# Patient Record
Sex: Female | Born: 1955
Health system: Southern US, Community
[De-identification: ages and names within clinical notes are randomized; demographics above are authoritative.]

---

## 2016-02-15 ENCOUNTER — Other Ambulatory Visit: Payer: Self-pay | Admitting: Internal Medicine

## 2016-02-15 DIAGNOSIS — R911 Solitary pulmonary nodule: Secondary | ICD-10-CM

## 2016-02-22 ENCOUNTER — Ambulatory Visit
Admission: RE | Admit: 2016-02-22 | Discharge: 2016-02-22 | Disposition: A | Discharge status: Home / Self Care | Payer: BLUE CROSS/BLUE SHIELD | Source: Ambulatory Visit | Attending: Internal Medicine | Admitting: Internal Medicine

## 2016-02-22 DIAGNOSIS — R911 Solitary pulmonary nodule: Secondary | ICD-10-CM

## 2016-03-27 DIAGNOSIS — E782 Mixed hyperlipidemia: Secondary | ICD-10-CM | POA: Diagnosis not present

## 2016-03-27 DIAGNOSIS — R911 Solitary pulmonary nodule: Secondary | ICD-10-CM | POA: Diagnosis not present

## 2016-03-27 DIAGNOSIS — I10 Essential (primary) hypertension: Secondary | ICD-10-CM | POA: Diagnosis not present

## 2016-03-27 DIAGNOSIS — E663 Overweight: Secondary | ICD-10-CM | POA: Diagnosis not present

## 2016-05-09 ENCOUNTER — Other Ambulatory Visit: Payer: Self-pay | Admitting: Obstetrics & Gynecology

## 2016-05-09 ENCOUNTER — Other Ambulatory Visit (HOSPITAL_COMMUNITY)
Admission: RE | Admit: 2016-05-09 | Discharge: 2016-05-09 | Disposition: A | Discharge status: Home / Self Care | Payer: BLUE CROSS/BLUE SHIELD | Source: Ambulatory Visit | Attending: Obstetrics & Gynecology | Admitting: Obstetrics & Gynecology

## 2016-05-09 DIAGNOSIS — Z01419 Encounter for gynecological examination (general) (routine) without abnormal findings: Secondary | ICD-10-CM | POA: Diagnosis not present

## 2016-05-09 DIAGNOSIS — Z1151 Encounter for screening for human papillomavirus (HPV): Secondary | ICD-10-CM | POA: Diagnosis not present

## 2016-05-11 LAB — CYTOLOGY - PAP

## 2016-05-12 ENCOUNTER — Other Ambulatory Visit: Payer: Self-pay

## 2016-05-12 DIAGNOSIS — Z1231 Encounter for screening mammogram for malignant neoplasm of breast: Secondary | ICD-10-CM

## 2016-05-26 ENCOUNTER — Ambulatory Visit: Payer: BLUE CROSS/BLUE SHIELD

## 2016-06-06 ENCOUNTER — Other Ambulatory Visit: Payer: Self-pay | Admitting: Obstetrics & Gynecology

## 2016-06-06 ENCOUNTER — Ambulatory Visit
Admission: RE | Admit: 2016-06-06 | Discharge: 2016-06-06 | Disposition: A | Discharge status: Home / Self Care | Payer: BLUE CROSS/BLUE SHIELD | Source: Ambulatory Visit

## 2016-06-06 DIAGNOSIS — Z1231 Encounter for screening mammogram for malignant neoplasm of breast: Secondary | ICD-10-CM

## 2016-09-01 DIAGNOSIS — Z0000 Encounter for general adult medical examination without abnormal findings: Secondary | ICD-10-CM | POA: Diagnosis not present

## 2016-09-01 DIAGNOSIS — Z23 Encounter for immunization: Secondary | ICD-10-CM | POA: Diagnosis not present

## 2016-09-01 DIAGNOSIS — E7800 Pure hypercholesterolemia, unspecified: Secondary | ICD-10-CM | POA: Diagnosis not present

## 2016-09-01 DIAGNOSIS — E663 Overweight: Secondary | ICD-10-CM | POA: Diagnosis not present

## 2017-02-02 ENCOUNTER — Ambulatory Visit
Admission: RE | Admit: 2017-02-02 | Discharge: 2017-02-02 | Disposition: A | Discharge status: Home / Self Care | Payer: BLUE CROSS/BLUE SHIELD | Source: Ambulatory Visit | Attending: Nurse Practitioner | Admitting: Nurse Practitioner

## 2017-02-02 ENCOUNTER — Other Ambulatory Visit: Payer: Self-pay | Admitting: Nurse Practitioner

## 2017-02-02 DIAGNOSIS — M79672 Pain in left foot: Secondary | ICD-10-CM

## 2017-02-02 DIAGNOSIS — M79675 Pain in left toe(s): Secondary | ICD-10-CM | POA: Diagnosis not present

## 2017-05-10 DIAGNOSIS — Z01411 Encounter for gynecological examination (general) (routine) with abnormal findings: Secondary | ICD-10-CM | POA: Diagnosis not present

## 2017-05-10 DIAGNOSIS — R61 Generalized hyperhidrosis: Secondary | ICD-10-CM | POA: Diagnosis not present

## 2017-05-10 DIAGNOSIS — N898 Other specified noninflammatory disorders of vagina: Secondary | ICD-10-CM | POA: Diagnosis not present

## 2017-07-11 ENCOUNTER — Other Ambulatory Visit: Payer: Self-pay | Admitting: Obstetrics & Gynecology

## 2017-07-11 DIAGNOSIS — Z1231 Encounter for screening mammogram for malignant neoplasm of breast: Secondary | ICD-10-CM

## 2017-07-18 ENCOUNTER — Ambulatory Visit
Admission: RE | Admit: 2017-07-18 | Discharge: 2017-07-18 | Disposition: A | Discharge status: Home / Self Care | Payer: BLUE CROSS/BLUE SHIELD | Source: Ambulatory Visit | Attending: Obstetrics & Gynecology | Admitting: Obstetrics & Gynecology

## 2017-07-18 DIAGNOSIS — Z1231 Encounter for screening mammogram for malignant neoplasm of breast: Secondary | ICD-10-CM

## 2017-09-03 ENCOUNTER — Other Ambulatory Visit: Payer: Self-pay | Admitting: Internal Medicine

## 2017-09-03 DIAGNOSIS — R911 Solitary pulmonary nodule: Secondary | ICD-10-CM | POA: Diagnosis not present

## 2017-09-03 DIAGNOSIS — I77810 Thoracic aortic ectasia: Secondary | ICD-10-CM

## 2017-09-03 DIAGNOSIS — E7800 Pure hypercholesterolemia, unspecified: Secondary | ICD-10-CM | POA: Diagnosis not present

## 2017-09-03 DIAGNOSIS — M2550 Pain in unspecified joint: Secondary | ICD-10-CM | POA: Diagnosis not present

## 2017-09-03 DIAGNOSIS — Z0000 Encounter for general adult medical examination without abnormal findings: Secondary | ICD-10-CM | POA: Diagnosis not present

## 2017-09-03 DIAGNOSIS — I10 Essential (primary) hypertension: Secondary | ICD-10-CM | POA: Diagnosis not present

## 2017-09-04 ENCOUNTER — Other Ambulatory Visit: Payer: Self-pay | Admitting: Internal Medicine

## 2017-09-04 ENCOUNTER — Ambulatory Visit
Admission: RE | Admit: 2017-09-04 | Discharge: 2017-09-04 | Disposition: A | Discharge status: Home / Self Care | Payer: BLUE CROSS/BLUE SHIELD | Source: Ambulatory Visit | Attending: Internal Medicine | Admitting: Internal Medicine

## 2017-09-04 DIAGNOSIS — I77810 Thoracic aortic ectasia: Secondary | ICD-10-CM

## 2017-09-04 MED ORDER — IOPAMIDOL (ISOVUE-370) INJECTION 76%: 75.0000 mL | Freq: Once | INTRAVENOUS | Status: DC | PRN

## 2017-09-05 ENCOUNTER — Ambulatory Visit
Admission: RE | Admit: 2017-09-05 | Discharge: 2017-09-05 | Disposition: A | Discharge status: Home / Self Care | Payer: BLUE CROSS/BLUE SHIELD | Source: Ambulatory Visit | Attending: Internal Medicine | Admitting: Internal Medicine

## 2017-09-05 DIAGNOSIS — I77810 Thoracic aortic ectasia: Secondary | ICD-10-CM

## 2017-09-05 DIAGNOSIS — I712 Thoracic aortic aneurysm, without rupture: Secondary | ICD-10-CM | POA: Diagnosis not present

## 2017-09-05 MED ORDER — IOPAMIDOL (ISOVUE-370) INJECTION 76%
75.0000 mL | Freq: Once | INTRAVENOUS | Status: AC | PRN
  Administered 2017-09-05: 75 mL via INTRAVENOUS

## 2017-10-16 DIAGNOSIS — Z23 Encounter for immunization: Secondary | ICD-10-CM | POA: Diagnosis not present

## 2017-11-07 DIAGNOSIS — M2550 Pain in unspecified joint: Secondary | ICD-10-CM | POA: Diagnosis not present

## 2017-11-07 DIAGNOSIS — R768 Other specified abnormal immunological findings in serum: Secondary | ICD-10-CM | POA: Diagnosis not present

## 2018-02-04 DIAGNOSIS — M2550 Pain in unspecified joint: Secondary | ICD-10-CM | POA: Diagnosis not present

## 2018-02-04 DIAGNOSIS — M7989 Other specified soft tissue disorders: Secondary | ICD-10-CM | POA: Diagnosis not present

## 2018-02-04 DIAGNOSIS — R768 Other specified abnormal immunological findings in serum: Secondary | ICD-10-CM | POA: Diagnosis not present

## 2018-03-01 ENCOUNTER — Other Ambulatory Visit: Payer: Self-pay | Admitting: Internal Medicine

## 2018-03-01 DIAGNOSIS — R911 Solitary pulmonary nodule: Secondary | ICD-10-CM

## 2018-03-15 ENCOUNTER — Other Ambulatory Visit: Payer: BLUE CROSS/BLUE SHIELD

## 2018-03-19 ENCOUNTER — Ambulatory Visit
Admission: RE | Admit: 2018-03-19 | Discharge: 2018-03-19 | Disposition: A | Discharge status: Home / Self Care | Payer: BLUE CROSS/BLUE SHIELD | Source: Ambulatory Visit | Attending: Internal Medicine | Admitting: Internal Medicine

## 2018-03-19 DIAGNOSIS — R911 Solitary pulmonary nodule: Secondary | ICD-10-CM | POA: Diagnosis not present

## 2018-05-13 DIAGNOSIS — Z01419 Encounter for gynecological examination (general) (routine) without abnormal findings: Secondary | ICD-10-CM | POA: Diagnosis not present

## 2018-05-14 IMAGING — CR DG TOE 2ND 2+V*L*
3 series · 3 of 3 positions shown · non-contrast
Comparison: None.

CLINICAL DATA: Pain for 1 week

EXAM:
LEFT SECOND TOE:  3 V

[t toes ap left]
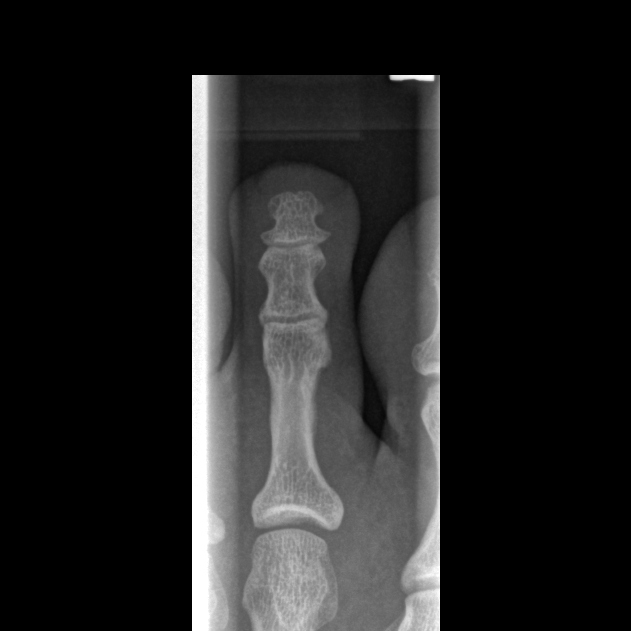

[t toes oblique left]
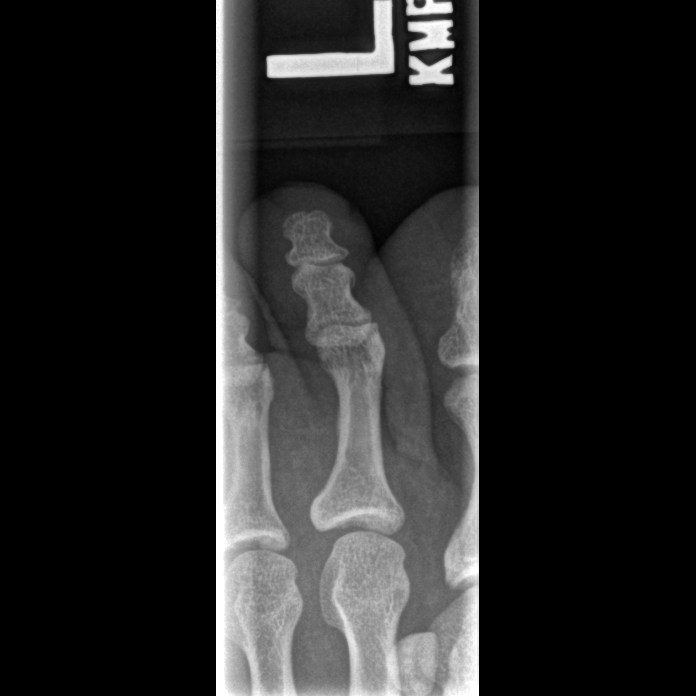

[t toes lateral left]
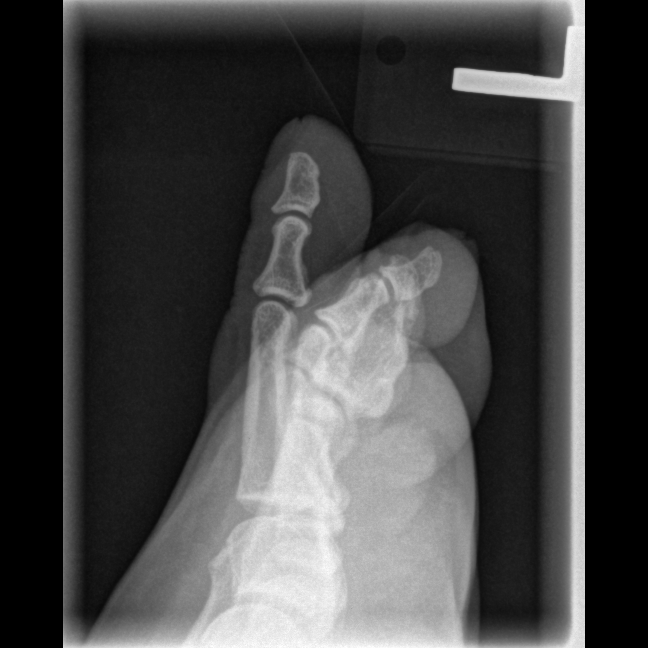

[3 of 3 positions shown; findings below may reference images not displayed]

FINDINGS: Frontal, oblique, and lateral views were obtained. There is no
fracture or dislocation. Joint spaces appear normal. No erosive
change.
IMPRESSION: No fracture or dislocation.  No evident arthropathy.

## 2018-07-09 DIAGNOSIS — R300 Dysuria: Secondary | ICD-10-CM | POA: Diagnosis not present

## 2018-09-19 ENCOUNTER — Other Ambulatory Visit: Payer: Self-pay | Admitting: Internal Medicine

## 2018-09-19 DIAGNOSIS — I77810 Thoracic aortic ectasia: Secondary | ICD-10-CM | POA: Diagnosis not present

## 2018-09-19 DIAGNOSIS — Z23 Encounter for immunization: Secondary | ICD-10-CM | POA: Diagnosis not present

## 2018-09-19 DIAGNOSIS — R739 Hyperglycemia, unspecified: Secondary | ICD-10-CM | POA: Diagnosis not present

## 2018-09-19 DIAGNOSIS — I10 Essential (primary) hypertension: Secondary | ICD-10-CM | POA: Diagnosis not present

## 2018-09-19 DIAGNOSIS — Z0000 Encounter for general adult medical examination without abnormal findings: Secondary | ICD-10-CM | POA: Diagnosis not present

## 2018-09-19 DIAGNOSIS — E7800 Pure hypercholesterolemia, unspecified: Secondary | ICD-10-CM | POA: Diagnosis not present

## 2018-09-26 ENCOUNTER — Ambulatory Visit
Admission: RE | Admit: 2018-09-26 | Discharge: 2018-09-26 | Disposition: A | Discharge status: Home / Self Care | Payer: BLUE CROSS/BLUE SHIELD | Source: Ambulatory Visit | Attending: Internal Medicine | Admitting: Internal Medicine

## 2018-09-26 DIAGNOSIS — I77810 Thoracic aortic ectasia: Secondary | ICD-10-CM

## 2018-09-26 DIAGNOSIS — R918 Other nonspecific abnormal finding of lung field: Secondary | ICD-10-CM | POA: Diagnosis not present

## 2018-09-26 MED ORDER — IOPAMIDOL (ISOVUE-370) INJECTION 76%
75.0000 mL | Freq: Once | INTRAVENOUS | Status: AC | PRN
  Administered 2018-09-26: 75 mL via INTRAVENOUS

## 2018-10-09 DIAGNOSIS — Z01818 Encounter for other preprocedural examination: Secondary | ICD-10-CM | POA: Diagnosis not present

## 2018-10-27 IMAGING — MG DIGITAL SCREENING BILATERAL MAMMOGRAM WITH CAD
7 series · 7 of 7 positions shown · non-contrast
Comparison: Previous exam(s).

CLINICAL DATA: Screening.

EXAM:
DIGITAL SCREENING BILATERAL MAMMOGRAM WITH CAD

[R CC (1 of 2)]
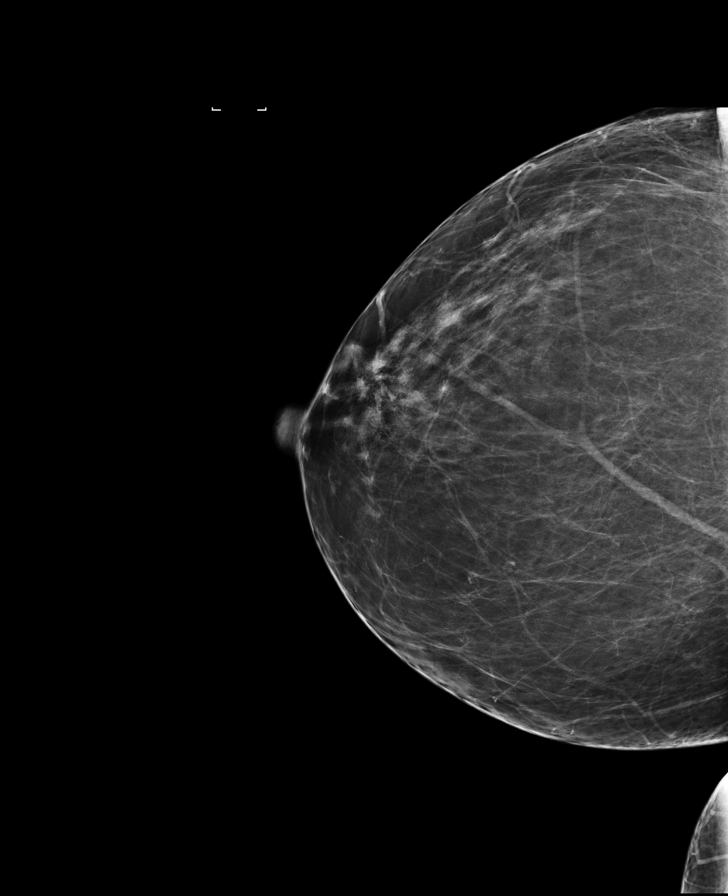

[L MLO (1 of 2)]
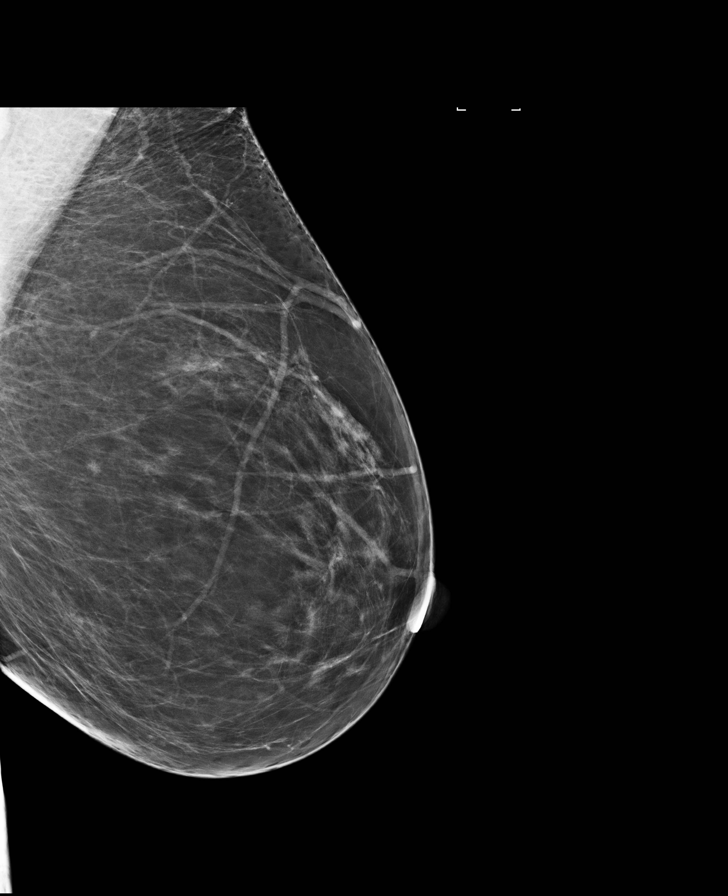

[L CC]
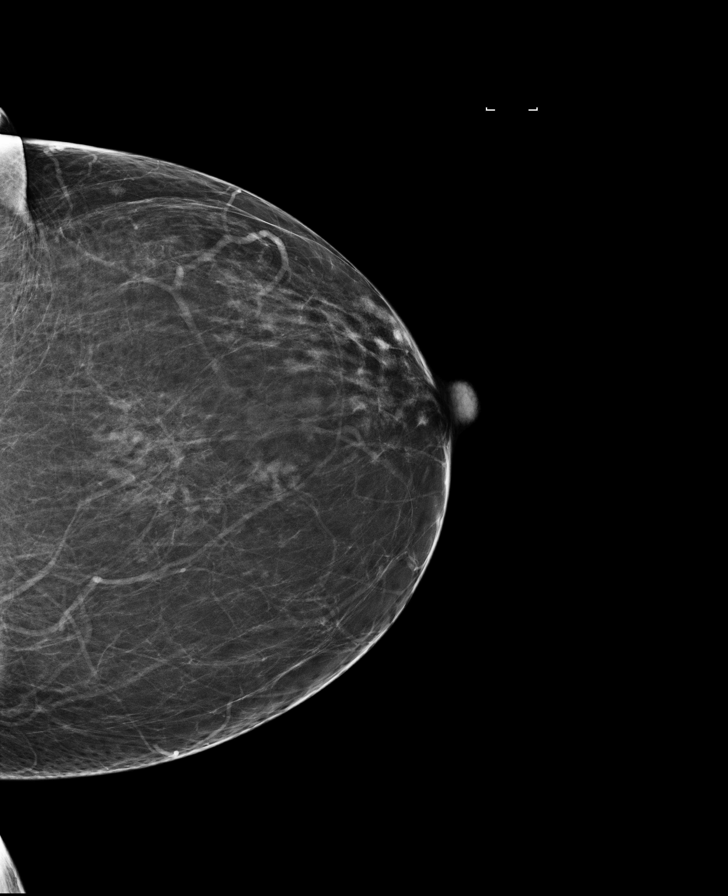

[L MLO (2 of 2)]
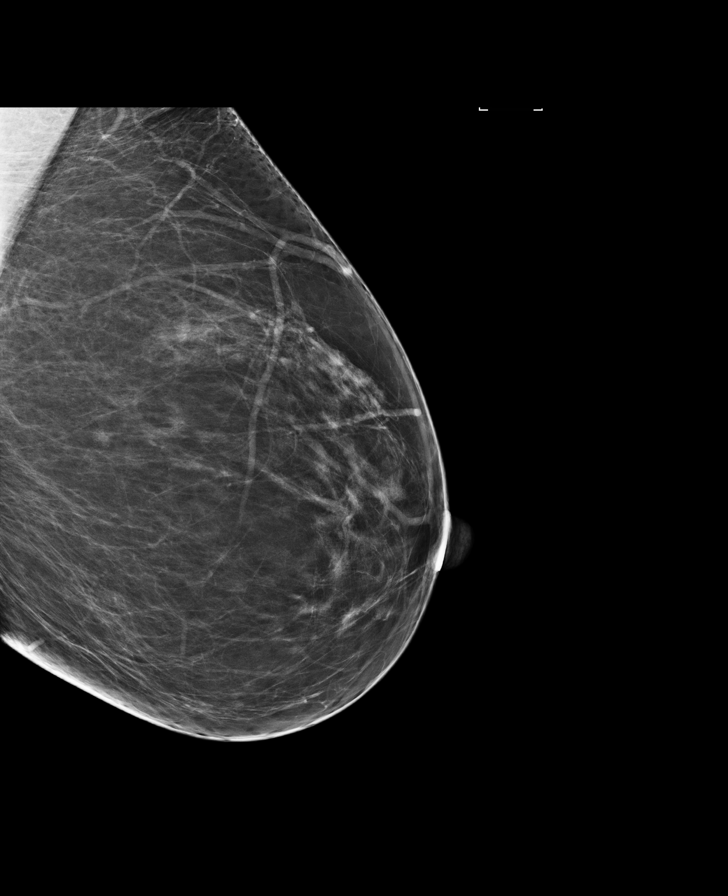

[R CC (2 of 2)]
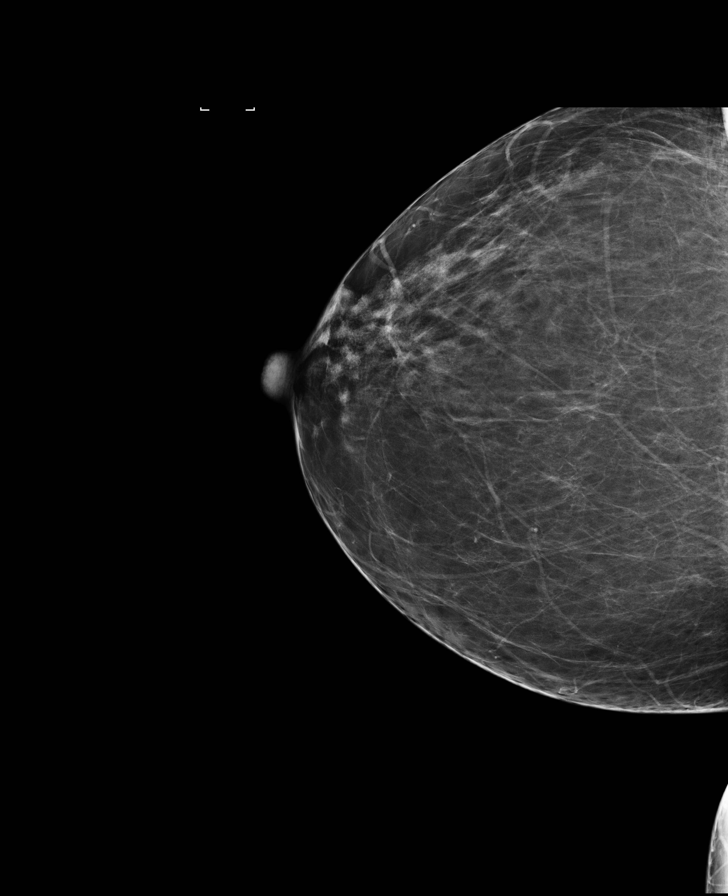

[R MLO (1 of 2)]
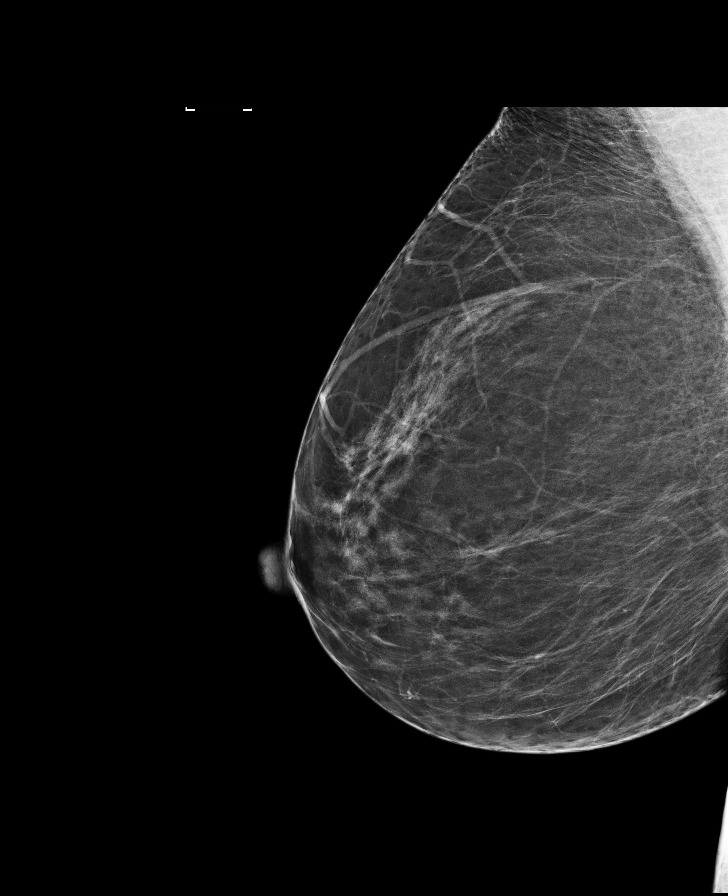

[R MLO (2 of 2)]
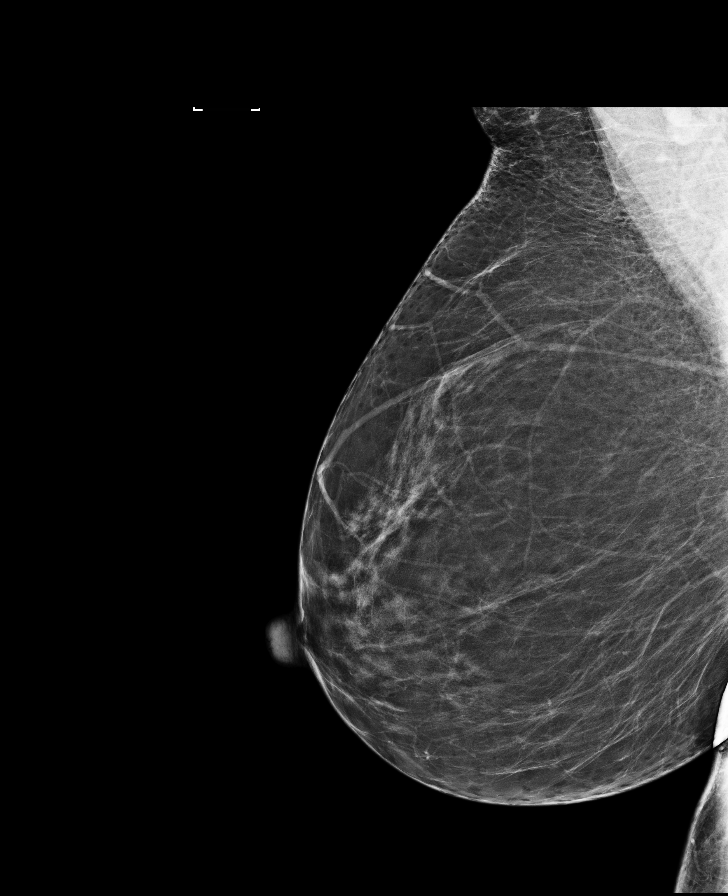

[7 of 7 positions shown; findings below may reference images not displayed]

ACR Breast Density Category b: There are scattered areas of
fibroglandular density.
FINDINGS: There are no findings suspicious for malignancy. Images were
processed with CAD.
IMPRESSION: No mammographic evidence of malignancy. A result letter of this
screening mammogram will be mailed directly to the patient.

RECOMMENDATION:
Screening mammogram in one year. (Code:AS-G-LCT)

BI-RADS CATEGORY  1: Negative.

## 2018-11-06 DIAGNOSIS — K5730 Diverticulosis of large intestine without perforation or abscess without bleeding: Secondary | ICD-10-CM | POA: Diagnosis not present

## 2018-11-06 DIAGNOSIS — Z1211 Encounter for screening for malignant neoplasm of colon: Secondary | ICD-10-CM | POA: Diagnosis not present

## 2018-11-06 DIAGNOSIS — K635 Polyp of colon: Secondary | ICD-10-CM | POA: Diagnosis not present

## 2018-11-08 DIAGNOSIS — K635 Polyp of colon: Secondary | ICD-10-CM | POA: Diagnosis not present

## 2018-11-08 DIAGNOSIS — Z1211 Encounter for screening for malignant neoplasm of colon: Secondary | ICD-10-CM | POA: Diagnosis not present

## 2019-07-04 DIAGNOSIS — Z01419 Encounter for gynecological examination (general) (routine) without abnormal findings: Secondary | ICD-10-CM | POA: Diagnosis not present

## 2019-07-07 ENCOUNTER — Other Ambulatory Visit: Payer: Self-pay | Admitting: Obstetrics & Gynecology

## 2019-07-07 DIAGNOSIS — Z1231 Encounter for screening mammogram for malignant neoplasm of breast: Secondary | ICD-10-CM

## 2019-08-20 ENCOUNTER — Ambulatory Visit
Admission: RE | Admit: 2019-08-20 | Discharge: 2019-08-20 | Disposition: A | Discharge status: Home / Self Care | Payer: BC Managed Care – PPO | Source: Ambulatory Visit | Attending: Obstetrics & Gynecology | Admitting: Obstetrics & Gynecology

## 2019-08-20 ENCOUNTER — Other Ambulatory Visit: Payer: Self-pay

## 2019-08-20 DIAGNOSIS — Z1231 Encounter for screening mammogram for malignant neoplasm of breast: Secondary | ICD-10-CM

## 2019-09-30 DIAGNOSIS — Z23 Encounter for immunization: Secondary | ICD-10-CM | POA: Diagnosis not present

## 2019-10-22 DIAGNOSIS — E7800 Pure hypercholesterolemia, unspecified: Secondary | ICD-10-CM | POA: Diagnosis not present

## 2019-10-22 DIAGNOSIS — E663 Overweight: Secondary | ICD-10-CM | POA: Diagnosis not present

## 2019-10-22 DIAGNOSIS — I10 Essential (primary) hypertension: Secondary | ICD-10-CM | POA: Diagnosis not present

## 2019-10-22 DIAGNOSIS — R7303 Prediabetes: Secondary | ICD-10-CM | POA: Diagnosis not present

## 2019-10-22 DIAGNOSIS — Z0000 Encounter for general adult medical examination without abnormal findings: Secondary | ICD-10-CM | POA: Diagnosis not present

## 2019-10-22 DIAGNOSIS — I77810 Thoracic aortic ectasia: Secondary | ICD-10-CM | POA: Diagnosis not present

## 2020-01-14 ENCOUNTER — Ambulatory Visit: Payer: BC Managed Care – PPO | Attending: Internal Medicine

## 2020-01-14 DIAGNOSIS — Z23 Encounter for immunization: Secondary | ICD-10-CM | POA: Insufficient documentation

## 2020-01-14 NOTE — Progress Notes (Signed)
   Covid-19 Vaccination Clinic  Name:  Naveah Brave    MRN: [id number] DOB: Apr 19, 1956  01/14/2020  Ms. Italiano was observed post Covid-19 immunization for 15 minutes without incidence. She was provided with Vaccine Information Sheet and instruction to access the V-Safe system.   Ms. Kegel was instructed to call 911 with any severe reactions post vaccine: Marland Kitchen Difficulty breathing  . Swelling of your face and throat  . A fast heartbeat  . A bad rash all over your body  . Dizziness and weakness    Immunizations Administered    Name Date Dose VIS Date Route   Pfizer COVID-19 Vaccine 01/14/2020 12:43 PM 0.3 mL 12/05/2019 Intramuscular   Manufacturer: [employer], [employer]   Lot: [id number]   NDC: [address]-0

## 2020-02-03 ENCOUNTER — Ambulatory Visit: Payer: BC Managed Care – PPO | Attending: Internal Medicine

## 2020-02-03 DIAGNOSIS — Z23 Encounter for immunization: Secondary | ICD-10-CM | POA: Insufficient documentation

## 2020-02-03 NOTE — Progress Notes (Signed)
   Covid-19 Vaccination Clinic  Name:  Rachel Gray    MRN: [id number] DOB: [birth year]  02/03/2020  Rachel Gray was observed post Covid-19 immunization for 15 minutes without incidence. She was provided with Vaccine Information Sheet and instruction to access the V-Safe system.   Rachel Gray was instructed to call 911 with any severe reactions post vaccine: Marland Kitchen Difficulty breathing  . Swelling of your face and throat  . A fast heartbeat  . A bad rash all over your body  . Dizziness and weakness    Immunizations Administered    Name Date Dose VIS Date Route   Pfizer COVID-19 Vaccine 02/03/2020 10:31 AM 0.3 mL 12/05/2019 Intramuscular   Manufacturer: [employer], [employer]   Lot: [id number]   NDC: [address]-9

## 2020-08-03 DIAGNOSIS — Z01419 Encounter for gynecological examination (general) (routine) without abnormal findings: Secondary | ICD-10-CM | POA: Diagnosis not present

## 2020-10-09 ENCOUNTER — Other Ambulatory Visit: Payer: Self-pay

## 2020-10-09 ENCOUNTER — Ambulatory Visit: Payer: BC Managed Care – PPO | Attending: Internal Medicine

## 2020-10-09 DIAGNOSIS — Z23 Encounter for immunization: Secondary | ICD-10-CM

## 2020-10-09 NOTE — Progress Notes (Signed)
   Covid-19 Vaccination Clinic  Name:  Stephanye Finnicum    MRN: [id number] DOB: 19-May-1956  10/09/2020  Ms. Manuele was observed post Covid-19 immunization for 15 minutes without incident. She was provided with Vaccine Information Sheet and instruction to access the V-Safe system.   Ms. Lupu was instructed to call 911 with any severe reactions post vaccine: Marland Kitchen Difficulty breathing  . Swelling of face and throat  . A fast heartbeat  . A bad rash all over body  . Dizziness and weakness

## 2020-10-26 DIAGNOSIS — H1045 Other chronic allergic conjunctivitis: Secondary | ICD-10-CM | POA: Diagnosis not present

## 2020-11-11 DIAGNOSIS — I77810 Thoracic aortic ectasia: Secondary | ICD-10-CM | POA: Diagnosis not present

## 2020-11-11 DIAGNOSIS — Z0000 Encounter for general adult medical examination without abnormal findings: Secondary | ICD-10-CM | POA: Diagnosis not present

## 2020-11-11 DIAGNOSIS — E7800 Pure hypercholesterolemia, unspecified: Secondary | ICD-10-CM | POA: Diagnosis not present

## 2020-11-11 DIAGNOSIS — I10 Essential (primary) hypertension: Secondary | ICD-10-CM | POA: Diagnosis not present

## 2021-08-11 DIAGNOSIS — H04123 Dry eye syndrome of bilateral lacrimal glands: Secondary | ICD-10-CM | POA: Diagnosis not present

## 2023-10-24 ENCOUNTER — Ambulatory Visit: Payer: Medicare Other | Admitting: Podiatry

## 2023-10-24 ENCOUNTER — Ambulatory Visit (INDEPENDENT_AMBULATORY_CARE_PROVIDER_SITE_OTHER): Payer: Medicare Other

## 2023-10-24 ENCOUNTER — Encounter: Payer: Self-pay | Admitting: Podiatry

## 2023-10-24 DIAGNOSIS — M722 Plantar fascial fibromatosis: Secondary | ICD-10-CM | POA: Diagnosis not present

## 2023-10-24 DIAGNOSIS — M79671 Pain in right foot: Secondary | ICD-10-CM

## 2023-10-24 NOTE — Progress Notes (Signed)
  Subjective:  Patient ID: Rachel Gray, female    DOB: 09-02-1956,   MRN: [id number]  Chief Complaint  Patient presents with   Foot Problem    Pt has bump on bottom of right foot, no pain.    67 y.o. female presents for concern of bump on her right foot. Relates she was getting a massage and was noticed about two weeks ago. Denies any pain with it. Relates she is not sure how long it has been there. She does do a lot of walking and never had trouble. Wanted to make sure it wasn't anything.  Denies any other pedal complaints. Denies n/v/f/c.   History reviewed. No pertinent past medical history.  Objective:  Physical Exam: Vascular: DP/PT pulses 2/4 bilateral. CFT <3 seconds. Normal hair growth on digits. No edema.  Skin. No lacerations or abrasions bilateral feet. About 2 cm mass noted to plantar medial arch. Mobile with the plantar fascia. Firm and non fluctuant. No pain to palpation.  Musculoskeletal: MMT 5/5 bilateral lower extremities in DF, PF, Inversion and Eversion. Deceased ROM in DF of ankle joint.  Neurological: Sensation intact to light touch.   Assessment:   1. Plantar fascial fibromatosis of right foot      Plan:  Patient was evaluated and treated and all questions answered. X-rays reviewed and discussed with patient. No osseous abnormalities noted.  Discussed the diagnosis of fibroma and treatment options with patient. Discussed offloading of the area and proper shoe wear. Discussed steroid injection into the area in the future if needed/if it start to hurt.  Did discuss surgical options and advised patient that fibroma resection is not 100% guaranteed. This would only be necessary if it became painful  Patient to follow-up as needed if symptoms fail to improve or worsen.    Louann Sjogren, DPM

## 2023-10-24 NOTE — Patient Instructions (Signed)

## 2024-02-07 DIAGNOSIS — Z0000 Encounter for general adult medical examination without abnormal findings: Secondary | ICD-10-CM | POA: Diagnosis not present

## 2024-02-07 DIAGNOSIS — I10 Essential (primary) hypertension: Secondary | ICD-10-CM | POA: Diagnosis not present

## 2024-02-07 DIAGNOSIS — E7800 Pure hypercholesterolemia, unspecified: Secondary | ICD-10-CM | POA: Diagnosis not present

## 2024-02-07 DIAGNOSIS — Z5181 Encounter for therapeutic drug level monitoring: Secondary | ICD-10-CM | POA: Diagnosis not present

## 2024-02-07 DIAGNOSIS — R7309 Other abnormal glucose: Secondary | ICD-10-CM | POA: Diagnosis not present

## 2024-02-11 DIAGNOSIS — H40023 Open angle with borderline findings, high risk, bilateral: Secondary | ICD-10-CM | POA: Diagnosis not present

## 2024-02-11 DIAGNOSIS — H2513 Age-related nuclear cataract, bilateral: Secondary | ICD-10-CM | POA: Diagnosis not present

## 2024-03-11 DIAGNOSIS — Z713 Dietary counseling and surveillance: Secondary | ICD-10-CM | POA: Diagnosis not present

## 2024-03-11 DIAGNOSIS — Z1231 Encounter for screening mammogram for malignant neoplasm of breast: Secondary | ICD-10-CM | POA: Diagnosis not present

## 2024-03-25 DIAGNOSIS — H2513 Age-related nuclear cataract, bilateral: Secondary | ICD-10-CM | POA: Diagnosis not present

## 2024-03-25 DIAGNOSIS — H40013 Open angle with borderline findings, low risk, bilateral: Secondary | ICD-10-CM | POA: Diagnosis not present

## 2024-04-01 DIAGNOSIS — Z713 Dietary counseling and surveillance: Secondary | ICD-10-CM | POA: Diagnosis not present

## 2024-05-24 DIAGNOSIS — I10 Essential (primary) hypertension: Secondary | ICD-10-CM | POA: Diagnosis not present

## 2024-05-24 DIAGNOSIS — E7800 Pure hypercholesterolemia, unspecified: Secondary | ICD-10-CM | POA: Diagnosis not present

## 2024-06-23 DIAGNOSIS — I10 Essential (primary) hypertension: Secondary | ICD-10-CM | POA: Diagnosis not present

## 2024-06-23 DIAGNOSIS — E7800 Pure hypercholesterolemia, unspecified: Secondary | ICD-10-CM | POA: Diagnosis not present
